# Patient Record
Sex: Male | Born: 1950 | Hispanic: Yes | Marital: Married | Smoking: Never smoker
Health system: Southern US, Community
[De-identification: ages and names within clinical notes are randomized; demographics above are authoritative.]

## PROBLEM LIST (undated history)

## (undated) DIAGNOSIS — Z789 Other specified health status: Secondary | ICD-10-CM

## (undated) HISTORY — DX: Other specified health status: Z78.9

## (undated) HISTORY — PX: HERNIA REPAIR: SHX51

---

## 2021-01-07 DIAGNOSIS — E782 Mixed hyperlipidemia: Secondary | ICD-10-CM | POA: Diagnosis not present

## 2021-01-07 DIAGNOSIS — K2100 Gastro-esophageal reflux disease with esophagitis, without bleeding: Secondary | ICD-10-CM | POA: Diagnosis not present

## 2021-01-07 DIAGNOSIS — E7849 Other hyperlipidemia: Secondary | ICD-10-CM | POA: Diagnosis not present

## 2021-01-13 DIAGNOSIS — R42 Dizziness and giddiness: Secondary | ICD-10-CM | POA: Diagnosis not present

## 2021-01-13 DIAGNOSIS — E7849 Other hyperlipidemia: Secondary | ICD-10-CM | POA: Diagnosis not present

## 2021-01-13 DIAGNOSIS — K5900 Constipation, unspecified: Secondary | ICD-10-CM | POA: Diagnosis not present

## 2021-01-13 DIAGNOSIS — K2100 Gastro-esophageal reflux disease with esophagitis, without bleeding: Secondary | ICD-10-CM | POA: Diagnosis not present

## 2021-01-13 DIAGNOSIS — R7989 Other specified abnormal findings of blood chemistry: Secondary | ICD-10-CM | POA: Diagnosis not present

## 2021-05-05 ENCOUNTER — Emergency Department (HOSPITAL_COMMUNITY)
Admission: EM | Admit: 2021-05-05 | Discharge: 2021-05-06 | Disposition: A | Discharge status: Home / Self Care | Payer: Medicare Other | Attending: Emergency Medicine | Admitting: Emergency Medicine

## 2021-05-05 ENCOUNTER — Other Ambulatory Visit: Payer: Self-pay

## 2021-05-05 ENCOUNTER — Emergency Department (HOSPITAL_COMMUNITY): Payer: Medicare Other

## 2021-05-05 DIAGNOSIS — N451 Epididymitis: Secondary | ICD-10-CM | POA: Diagnosis not present

## 2021-05-05 DIAGNOSIS — Z8719 Personal history of other diseases of the digestive system: Secondary | ICD-10-CM | POA: Insufficient documentation

## 2021-05-05 DIAGNOSIS — R109 Unspecified abdominal pain: Secondary | ICD-10-CM | POA: Diagnosis not present

## 2021-05-05 DIAGNOSIS — E782 Mixed hyperlipidemia: Secondary | ICD-10-CM | POA: Diagnosis not present

## 2021-05-05 DIAGNOSIS — E7849 Other hyperlipidemia: Secondary | ICD-10-CM | POA: Diagnosis not present

## 2021-05-05 DIAGNOSIS — Z20822 Contact with and (suspected) exposure to covid-19: Secondary | ICD-10-CM | POA: Insufficient documentation

## 2021-05-05 DIAGNOSIS — N50811 Right testicular pain: Secondary | ICD-10-CM | POA: Diagnosis present

## 2021-05-05 DIAGNOSIS — R7989 Other specified abnormal findings of blood chemistry: Secondary | ICD-10-CM | POA: Diagnosis not present

## 2021-05-05 LAB — URINALYSIS, ROUTINE W REFLEX MICROSCOPIC
Bilirubin Urine: NEGATIVE
Glucose, UA: NEGATIVE mg/dL
Ketones, ur: NEGATIVE mg/dL
Nitrite: NEGATIVE
Protein, ur: NEGATIVE mg/dL
Specific Gravity, Urine: 1.013 (ref 1.005–1.030)
pH: 5 (ref 5.0–8.0)

## 2021-05-05 LAB — CBC WITH DIFFERENTIAL/PLATELET
Abs Immature Granulocytes: 0.07 10*3/uL (ref 0.00–0.07)
Basophils Absolute: 0.1 10*3/uL (ref 0.0–0.1)
Basophils Relative: 1 %
Eosinophils Absolute: 0.2 10*3/uL (ref 0.0–0.5)
Eosinophils Relative: 1 %
HCT: 42.1 % (ref 39.0–52.0)
Hemoglobin: 14.2 g/dL (ref 13.0–17.0)
Immature Granulocytes: 1 %
Lymphocytes Relative: 16 %
Lymphs Abs: 1.8 10*3/uL (ref 0.7–4.0)
MCH: 32 pg (ref 26.0–34.0)
MCHC: 33.7 g/dL (ref 30.0–36.0)
MCV: 94.8 fL (ref 80.0–100.0)
Monocytes Absolute: 1.8 10*3/uL — ABNORMAL HIGH (ref 0.1–1.0)
Monocytes Relative: 17 %
Neutro Abs: 7 10*3/uL (ref 1.7–7.7)
Neutrophils Relative %: 64 %
Platelets: 215 10*3/uL (ref 150–400)
RBC: 4.44 MIL/uL (ref 4.22–5.81)
RDW: 13 % (ref 11.5–15.5)
WBC: 10.8 10*3/uL — ABNORMAL HIGH (ref 4.0–10.5)
nRBC: 0 % (ref 0.0–0.2)

## 2021-05-05 LAB — BASIC METABOLIC PANEL
Anion gap: 8 (ref 5–15)
BUN: 20 mg/dL (ref 8–23)
CO2: 25 mmol/L (ref 22–32)
Calcium: 8.9 mg/dL (ref 8.9–10.3)
Chloride: 101 mmol/L (ref 98–111)
Creatinine, Ser: 1.06 mg/dL (ref 0.61–1.24)
GFR, Estimated: >60 mL/min (ref >60)
Glucose, Bld: 93 mg/dL (ref 70–99)
Potassium: 3.9 mmol/L (ref 3.5–5.1)
Sodium: 134 mmol/L — ABNORMAL LOW (ref 135–145)

## 2021-05-05 LAB — RESP PANEL BY RT-PCR (FLU A&B, COVID) ARPGX2
Influenza A by PCR: NEGATIVE
Influenza B by PCR: NEGATIVE
SARS Coronavirus 2 by RT PCR: NEGATIVE

## 2021-05-05 MED ORDER — IOHEXOL 300 MG/ML  SOLN
100.0000 mL | Freq: Once | INTRAMUSCULAR | Status: AC | PRN
  Administered 2021-05-05: 100 mL via INTRAVENOUS

## 2021-05-05 NOTE — ED Provider Triage Note (Signed)
Emergency Medicine Provider Triage Evaluation Note  Eric Bridges , a 71 y.o. male  was evaluated in triage.  Pt complains of testicular pain onset yesterday.  He was evaluated by his primary care provider who noted that he has a hernia going through his testicle.  Patient has associated right testicular pain.  Review of Systems  Positive: As per HPI above Negative:   Physical Exam  BP 131/73 (BP Location: Right Arm)    Pulse 97    Temp 98.7 F (37.1 C) (Oral)    Resp 18    Ht 5' 8.9" (1.75 m)    Wt 85.7 kg    SpO2 97%    BMI 27.99 kg/m  Gen:   Awake, no distress   Resp:  Normal effort MSK:   Moves extremities without difficulty  Other:  Nurse tech present as chaperone for GU exam.  Erythema noted to right testicle.  Right testicle enlarged on exam.  Bulging noted to right testicle with tenderness to palpation.  No obvious evidence of Fournier's on exam.  Medical Decision Making  Medically screening exam initiated at 8:10 PM.  Appropriate orders placed.  Eric Bridges was informed that the remainder of the evaluation will be completed by another provider, this initial triage assessment does not replace that evaluation, and the importance of remaining in the ED until their evaluation is complete.   Cobain Morici A, PA-C 05/05/21 2158

## 2021-05-05 NOTE — ED Triage Notes (Signed)
Pt states that he went to his primary today and was sent here because his right testicular torsion.

## 2021-05-06 DIAGNOSIS — N451 Epididymitis: Secondary | ICD-10-CM | POA: Diagnosis not present

## 2021-05-06 MED ORDER — LEVOFLOXACIN 500 MG PO TABS
500.0000 mg | ORAL_TABLET | Freq: Every day | ORAL | Status: DC
Start: 2021-05-06 — End: 2021-05-06
  Administered 2021-05-06: 500 mg via ORAL
  Filled 2021-05-06: qty 1

## 2021-05-06 MED ORDER — LEVOFLOXACIN 500 MG PO TABS: 500.0000 mg | ORAL_TABLET | Freq: Every day | ORAL | 0 refills | Status: DC

## 2021-05-06 MED ORDER — NAPROXEN 500 MG PO TABS
500.0000 mg | ORAL_TABLET | Freq: Once | ORAL | Status: AC
  Administered 2021-05-06: 500 mg via ORAL
  Filled 2021-05-06: qty 1

## 2021-05-06 NOTE — Discharge Instructions (Addendum)
Your evaluation in the emergency department today showed epididymitis.  Take Levaquin as prescribed until finished.  We recommend follow-up with urology.  Should you desire repair of your inguinal hernia, follow-up with [facility].  Call to make an appointment with their office at the number listed below.  You may see your primary care doctor in the interim to ensure resolution of your epididymitis.  Return to the ED for new or concerning symptoms.

## 2021-05-07 LAB — URINE CULTURE: Culture: <10000 — AB

## 2021-05-08 DIAGNOSIS — N451 Epididymitis: Secondary | ICD-10-CM | POA: Diagnosis not present

## 2021-05-08 DIAGNOSIS — K5900 Constipation, unspecified: Secondary | ICD-10-CM | POA: Diagnosis not present

## 2021-05-08 DIAGNOSIS — K2100 Gastro-esophageal reflux disease with esophagitis, without bleeding: Secondary | ICD-10-CM | POA: Diagnosis not present

## 2021-05-08 DIAGNOSIS — E7849 Other hyperlipidemia: Secondary | ICD-10-CM | POA: Diagnosis not present

## 2021-05-08 NOTE — ED Provider Notes (Signed)
[facility] DEPT Provider Note   CSN: [id number] Arrival date & time: 05/05/21  1919     History  Chief Complaint  Patient presents with   testicle hernia    Eric Bridges is a 71 y.o. male.  71 year old male presents to the emergency department for evaluation of right sided testicular pain which began yesterday.  Pain has been constant and unchanged.  It is slightly improved with rest and aggravated with movement such as when going to stand from a seated position.  He has not had any fevers, inability to void.  Has taken Tylenol for symptoms with little improvement.  Was told in [place] that he does have a right-sided inguinal hernia.  The history is provided by the patient. No language interpreter was used.      Home Medications Prior to Admission medications   Medication Sig Start Date End Date Taking? Authorizing Provider  levofloxacin (LEVAQUIN) 500 MG tablet Take 1 tablet (500 mg total) by mouth daily. Take your first dose on the morning of 05/07/21. 05/06/21  Yes Antonietta Breach, PA-C      Allergies    Patient has no allergy information on record.    Review of Systems   Review of Systems Ten systems reviewed and are negative for acute change, except as noted in the HPI.    Physical Exam Updated Vital Signs BP 128/76    Pulse 74    Temp 98.7 F (37.1 C) (Oral)    Resp 19    Ht 5' 8.9" (1.75 m)    Wt 85.7 kg    SpO2 98%    BMI 27.99 kg/m   Physical Exam Vitals and nursing note reviewed.  Constitutional:      General: He is not in acute distress.    Appearance: He is well-developed. He is not diaphoretic.     Comments: Nontoxic appearing and in NAD  HENT:     Head: Normocephalic and atraumatic.  Eyes:     General: No scleral icterus.    Conjunctiva/sclera: Conjunctivae normal.  Pulmonary:     Effort: Pulmonary effort is normal. No respiratory distress.     Comments: Respirations even and unlabored Genitourinary:    Comments: Deferred.  Chaperoned GU exam completed by prior provider noting "erythema to right testicle.  Right testicle enlarged on exam.  Bulging noted to right testicle with tenderness to palpation." Musculoskeletal:        General: Normal range of motion.     Cervical back: Normal range of motion.  Skin:    General: Skin is warm and dry.     Coloration: Skin is not pale.     Findings: No erythema or rash.  Neurological:     Mental Status: He is alert and oriented to person, place, and time.     Coordination: Coordination normal.  Psychiatric:        Behavior: Behavior normal.    ED Results / Procedures / Treatments   Labs (all labs ordered are listed, but only abnormal results are displayed) Labs Reviewed  URINE CULTURE - Abnormal; Notable for the following components:      Result Value   Culture   (*)    Value: <10,000 COLONIES/mL INSIGNIFICANT GROWTH Performed at [facility], [address] [place], [place] [address]    All other components within normal limits  URINALYSIS, ROUTINE W REFLEX MICROSCOPIC - Abnormal; Notable for the following components:   APPearance HAZY (*)    Hgb  urine dipstick MODERATE (*)    Leukocytes,Ua LARGE (*)    Bacteria, UA RARE (*)    All other components within normal limits  BASIC METABOLIC PANEL - Abnormal; Notable for the following components:   Sodium 134 (*)    All other components within normal limits  CBC WITH DIFFERENTIAL/PLATELET - Abnormal; Notable for the following components:   WBC 10.8 (*)    Monocytes Absolute 1.8 (*)    All other components within normal limits  RESP PANEL BY RT-PCR (FLU A&B, COVID) [id number]    EKG None  Radiology CT ABDOMEN PELVIS W CONTRAST  Result Date: 05/05/2021 CLINICAL DATA:  Testicular pain and swelling EXAM: CT ABDOMEN AND PELVIS WITH CONTRAST TECHNIQUE: Multidetector CT imaging of the abdomen and pelvis was performed using the standard protocol following bolus administration of intravenous contrast. RADIATION  DOSE REDUCTION: This exam was performed according to the departmental dose-optimization program which includes automated exposure control, adjustment of the mA and/or kV according to patient size and/or use of iterative reconstruction technique. CONTRAST:  OMNIPAQUE IOHEXOL 300 MG/ML  SOLN COMPARISON:  Ultrasound from earlier in the same day FINDINGS: Lower chest: Mild scarring is noted in the bases bilaterally. Hepatobiliary: Fatty infiltration of the liver is noted. The gallbladder is decompressed. Pancreas: Unremarkable. No pancreatic ductal dilatation or surrounding inflammatory changes. Spleen: Normal in size without focal abnormality. Adrenals/Urinary Tract: Adrenal glands are within normal limits bilaterally. Kidneys demonstrate a normal enhancement pattern. Normal excretion is noted bilaterally. Scattered cysts are noted within the kidneys bilaterally. The largest of these is noted in the anterior upper pole on the left measuring 2.2 cm. No renal calculi or obstructive changes are noted. The bladder is well distended. Stomach/Bowel: Scattered diverticular change of the colon is noted without evidence of diverticulitis. The appendix is within normal limits. Small bowel and stomach are unremarkable. Vascular/Lymphatic: No significant vascular findings are present. No enlarged abdominal or pelvic lymph nodes. Reproductive: Prostate is prominent indenting upon the inferior aspect of the bladder. Mild hyperemia is noted in the right scrotum similar to that seen on the prior ultrasound examination consistent with the known epididymitis. Other: Large fat containing right inguinal hernia is noted. This may contribute to the patient's underlying discomfort. Musculoskeletal: No acute or significant osseous findings. IMPRESSION: Hyperemia in the right scrotum consistent with the known epididymitis. Large fat containing right inguinal hernia which may contribute to the patient's discomfort. Bilateral renal cysts.  Diverticulosis of the colon without diverticulitis. Electronically Signed   By: Alcide Clever M.D.   On: 05/05/2021 23:05   US SCROTUM W/DOPPLER  Result Date: 05/05/2021 CLINICAL DATA:  Right testicle pain and swelling EXAM: SCROTAL ULTRASOUND DOPPLER ULTRASOUND OF THE TESTICLES TECHNIQUE: Complete ultrasound examination of the testicles, epididymis, and other scrotal structures was performed. Color and spectral Doppler ultrasound were also utilized to evaluate blood flow to the testicles. COMPARISON:  None. FINDINGS: Right testicle Measurements: 4 x 3.2 x 3.3 cm. No mass or microlithiasis visualized. Left testicle Measurements: 4.6 x 2.9 x 3.2 cm. No mass or microlithiasis visualized. Right epididymis: Enlarged heterogeneous epididymal tail with hyperemia. Left epididymis:  Normal in size and appearance. Hydrocele:  Small bilateral hydroceles. Varicocele:  None visualized. Pulsed Doppler interrogation of both testes demonstrates normal low resistance arterial and venous waveforms bilaterally. IMPRESSION: 1. Negative for testicular torsion. 2. Slightly enlarged hyperemic right epididymal tail suggesting epididymitis 3. Small bilateral hydroceles Electronically Signed   By: Jasmine Pang M.D.   On: 05/05/2021 21:30  Procedures Procedures    Medications Ordered in ED Medications  iohexol (OMNIPAQUE) 300 MG/ML solution 100 mL (100 mLs Intravenous Contrast Given 05/05/21 2242)  naproxen (NAPROSYN) tablet 500 mg (500 mg Oral Given 05/06/21 0133)    ED Course/ Medical Decision Making/ A&P Clinical Course as of 05/08/21 1750  Tue May 06, 2021  0108 On chart review, patient had CT abdomen pelvis in 2018 which showed fat-containing right inguinal hernia.  This is persistent on imaging today without evidence of bowel involvement. [KH]    Clinical Course User Index [KH] Antonietta Breach, PA-C                           Medical Decision Making Amount and/or Complexity of Data Reviewed Labs:  ordered.  Risk Prescription drug management.   This patient presents to the ED for concern of R testicular pain, this involves an extensive number of treatment options, and is a complaint that carries with it a high risk of complications and morbidity.  The differential diagnosis includes epididymitis vs hydrocele vs orchitis vs testicular mass vs hernia vs testicular torsion.   Co morbidities that complicate the patient evaluation  Obesity    Additional history obtained:  Additional history obtained from wife at bedside External records from outside source obtained and reviewed including prior CT imaging from 2018   Lab Tests:  I Ordered, and personally interpreted labs.  The pertinent results include:  Mild leukocytosis of 10.8, UA c/w pyuria (large leuks @ 21-50, microscopic hematuria @ 11-20)   Imaging Studies ordered:  I ordered imaging studies including CT abdomen pelvis and scrotal US  I independently visualized and interpreted imaging which showed R inguinal hernia containing fat only; no bowel involvement. Imaging findings also c/w R epididymitis.  I agree with the radiologist interpretation   Cardiac Monitoring:  The patient was maintained on a cardiac monitor.  I personally viewed and interpreted the cardiac monitored which showed an underlying rhythm of: NSR   Medicines ordered and prescription drug management:  I ordered medication including Levaquin 500mg  PO for treatment initiation of epididymitis, Naproxen 500mg  PO for pain Reevaluation of the patient after these medicines showed that the patient improved I have reviewed the patients home medicines and have made adjustments as needed   Reevaluation:  After the interventions noted above, I reevaluated the patient and found that they have :improved   Social Determinants of Health:  Insured Good social support, wife at bedside   Dispostion:  After consideration of the diagnostic results and the  patients response to treatment, I feel that the patent would benefit from initiation of Levaquin 500mg  QD x 10 days with outpatient Urologic follow up. Referral to Urology provided. Patient also given referral to general surgery should he desire elective hernia repair. No evidence bowel involvement with R inguinal hernia. Hernia is known to be chronic since at least 2018; stable. Return precautions discussed and provided. Patient discharged in stable condition with no unaddressed concerns.          Final Clinical Impression(s) / ED Diagnoses Final diagnoses:  Epididymitis, right  History of right inguinal hernia    Rx / DC Orders ED Discharge Orders          Ordered    Ambulatory referral to Urology       Comments: Epididymitis   05/06/21 0115    levofloxacin (LEVAQUIN) 500 MG tablet  Daily        05/06/21  Finley, Smantha Boakye, PA-C [id number]    Campbell Stall P, DO [id number]

## 2021-05-22 ENCOUNTER — Ambulatory Visit (INDEPENDENT_AMBULATORY_CARE_PROVIDER_SITE_OTHER): Payer: Medicare Other | Admitting: Urology

## 2021-05-22 ENCOUNTER — Other Ambulatory Visit: Payer: Self-pay

## 2021-05-22 ENCOUNTER — Encounter: Payer: Self-pay | Admitting: Urology

## 2021-05-22 VITALS — BP 164/94 | HR 74 | Ht 68.9 in | Wt 189.0 lb

## 2021-05-22 DIAGNOSIS — N529 Male erectile dysfunction, unspecified: Secondary | ICD-10-CM

## 2021-05-22 DIAGNOSIS — N451 Epididymitis: Secondary | ICD-10-CM | POA: Diagnosis not present

## 2021-05-22 DIAGNOSIS — K4090 Unilateral inguinal hernia, without obstruction or gangrene, not specified as recurrent: Secondary | ICD-10-CM | POA: Diagnosis not present

## 2021-05-22 MED ORDER — SILDENAFIL CITRATE 20 MG PO TABS: ORAL_TABLET | ORAL | 11 refills | Status: AC

## 2021-05-22 NOTE — Progress Notes (Signed)
? ?Assessment: ?1. Epididymitis, right   ?2. Non-recurrent unilateral inguinal hernia without obstruction or gangrene   ?3. Organic impotence   ? ? ?Plan: ?I reviewed the patient's chart including his ER visit, lab results, and imaging results. ?Diagnosis of epididymitis discussed with the patient and his wife. ?He requests referral to general surgery for evaluation of his right inguinal hernia. ?Prescription for sildenafil 50 mg as needed provided. ?Return to office as needed. ? ?Chief Complaint:  ?Chief Complaint  ?Patient presents with  ? epididymitis  ? ? ?History of Present Illness: ? ?Eric Bridges is a 71 y.o. year old male who is seen in consultation from Antony Madura, PA-C for evaluation of right epididymitis.  He presented to the emergency room on 05/05/2021 with right scrotal pain and swelling.  He noted onset of some discomfort as well as subjective fever and chills approximately 3 days prior.  He also noted some dysuria.  Urinalysis showed 11-20 RBCs, 21-50 WBCs and rare bacteria.  WBC 10.8.  Scrotal ultrasound demonstrated an enlarged hyperemic right epididymal tail suggestive of epididymitis.  CT imaging showed normal kidneys with bilateral renal cysts, no stones or evidence of obstruction and a large fat-containing right inguinal hernia.  He was treated with Levaquin x10 days.  His symptoms resolved.  He is no longer having any scrotal pain.  He has been aware of the right inguinal hernia for several years.  He is interested in possible surgery. ? ?He also reports difficulty achieving and maintaining his erection.  He has no decrease in his libido.  He has never taken any medical therapy for this.  He does not use any nitrates. ? ?History obtained through the assistance of an interpreter. ? ?Past Medical History:  ?Past Medical History:  ?Diagnosis Date  ? Known health problems: none   ? ? ?Past Surgical History:  ?Past Surgical History:  ?Procedure Laterality Date  ? HERNIA REPAIR    ? ? ?Allergies:   ?Allergies  ?Allergen Reactions  ? Penicillin G Other (See Comments)  ?  Made him feel bad as a child  ? ? ?Family History:  ?History reviewed. No pertinent family history. ? ?Social History:  ?Social History  ? ?Tobacco Use  ? Smoking status: Never  ? Smokeless tobacco: Never  ?Substance Use Topics  ? Alcohol use: Not Currently  ? ? ?Review of symptoms:  ?Constitutional:  Negative for unexplained weight loss, night sweats, fever, chills ?ENT:  Negative for nose bleeds, sinus pain, painful swallowing ?CV:  Negative for chest pain, shortness of breath, exercise intolerance, palpitations, loss of consciousness ?Resp:  Negative for cough, wheezing, shortness of breath ?GI:  Negative for nausea, vomiting, diarrhea, bloody stools ?GU:  Positives noted in HPI; otherwise negative for gross hematuria, urinary incontinence ?Neuro:  Negative for seizures, poor balance, limb weakness, slurred speech ?Psych:  Negative for lack of energy, depression, anxiety ?Endocrine:  Negative for polydipsia, polyuria, symptoms of hypoglycemia (dizziness, hunger, sweating) ?Hematologic:  Negative for anemia, purpura, petechia, prolonged or excessive bleeding, use of anticoagulants  ?Allergic:  Negative for difficulty breathing or choking as a result of exposure to anything; no shellfish allergy; no allergic response (rash/itch) to materials, foods ? ?Physical exam: ?BP (!) 164/94   Pulse 74   Ht 5' 8.9" (1.75 m)   Wt 189 lb (85.7 kg)   BMI 27.99 kg/m?  ?GENERAL APPEARANCE:  Well appearing, well developed, well nourished, NAD ?HEENT: Atraumatic, Normocephalic, oropharynx clear. ?NECK: Supple without lymphadenopathy or thyromegaly. ?LUNGS: Clear to  auscultation bilaterally. ?HEART: Regular Rate and Rhythm without murmurs, gallops, or rubs. ?ABDOMEN: Soft, non-tender, No Masses.  Reducible right inguinal hernia ?EXTREMITIES: Moves all extremities well.  Without clubbing, cyanosis, or edema. ?NEUROLOGIC:  Alert and oriented x 3, normal gait,  CN II-XII grossly intact.  ?MENTAL STATUS:  Appropriate. ?BACK:  Non-tender to palpation.  No CVAT ?SKIN:  Warm, dry and intact.   ?GU: ?Penis:  uncircumcised ?Meatus: Normal ?Scrotum: No significant erythema; slight enlargement of right scrotum ?Testis: normal without masses bilateral ?Epididymis: Some enlargement of the right epididymal head, nontender ? ? ?Results: ?U/A 0-2 RBC ? ?

## 2021-05-23 DIAGNOSIS — N451 Epididymitis: Secondary | ICD-10-CM | POA: Diagnosis not present

## 2021-05-23 LAB — URINALYSIS, ROUTINE W REFLEX MICROSCOPIC
Bilirubin, UA: NEGATIVE
Glucose, UA: NEGATIVE
Ketones, UA: NEGATIVE
Leukocytes,UA: NEGATIVE
Nitrite, UA: NEGATIVE
Protein,UA: NEGATIVE
Specific Gravity, UA: 1.025 (ref 1.005–1.030)
Urobilinogen, Ur: 2 mg/dL — ABNORMAL HIGH (ref 0.2–1.0)
pH, UA: 6 (ref 5.0–7.5)

## 2021-05-23 LAB — MICROSCOPIC EXAMINATION
Bacteria, UA: NONE SEEN
Epithelial Cells (non renal): NONE SEEN /hpf (ref 0–10)
Renal Epithel, UA: NONE SEEN /hpf
WBC, UA: NONE SEEN /hpf (ref 0–5)

## 2021-05-23 NOTE — Addendum Note (Signed)
Addended by: Ferdinand Lango on: 05/23/2021 09:31 AM ? ? Modules accepted: Orders ? ?

## 2021-05-29 ENCOUNTER — Encounter: Payer: Self-pay | Admitting: *Deleted

## 2021-06-10 ENCOUNTER — Encounter: Payer: Self-pay | Admitting: General Surgery

## 2021-06-10 ENCOUNTER — Other Ambulatory Visit: Payer: Self-pay

## 2021-06-10 ENCOUNTER — Ambulatory Visit (INDEPENDENT_AMBULATORY_CARE_PROVIDER_SITE_OTHER): Payer: Medicare Other | Admitting: General Surgery

## 2021-06-10 VITALS — BP 156/95 | HR 82 | Temp 97.9°F | Resp 16 | Ht 68.0 in | Wt 186.0 lb

## 2021-06-10 DIAGNOSIS — K4090 Unilateral inguinal hernia, without obstruction or gangrene, not specified as recurrent: Secondary | ICD-10-CM

## 2021-06-10 NOTE — Patient Instructions (Signed)
Call once you are ready to schedule your surgery. If you want it in April (at the end) we can accomodate that or we can do it in August. We will just see you before surgery in August if you opt for that timeframe.  ? ?Llame una vez que est? listo para programar su cirug?a. Si lo quieres en abril (al final) podemos acomodarlo o podemos hacerlo en agosto. Solo lo veremos antes de la cirug?a en agosto si opta por ese per?odo de tiempo. ? ?Open Hernia Repair, Adult ?Open hernia repair is a surgical procedure to fix a hernia. A hernia occurs when an internal organ or tissue pushes through a weak spot in the muscles along the wall of the abdomen. Hernias commonly occur in the groin and around the belly button. ?Most hernias tend to get worse over time. Often, surgery is done to prevent the hernia from becoming bigger, uncomfortable, or an emergency. Emergency surgery may be needed if contents of the abdomen get stuck in the opening (incarcerated hernia) or if the blood supply gets cut off (strangulated hernia). In an open repair, an incision is made in the abdomen to perform the surgery. ?Tell a health care provider about: ?Any allergies you have. ?All medicines you are taking, including vitamins, herbs, eye drops, creams, and over-the-counter medicines. ?Any problems you or family members have had with anesthetic medicines. ?Any blood or bone disorders you have. ?Any surgeries you have had. ?Any medical conditions you have, including any recent cold or flu (influenza)symptoms. ?Whether you are pregnant or may be pregnant. ?What are the risks? ?Generally, this is a safe procedure. However, problems may occur, including: ?Long-lasting (chronic) pain. ?Bleeding. ?Infection. ?Damage to the testicles. This can cause shrinking or swelling. ?Damage to nearby structures or organs, including the bladder, blood vessels, intestines, or nerves near the hernia. ?Blood clots. ?Trouble passing urine. ?Return of the hernia. ?What  happens before the procedure? ?Medicines ?Ask your health care provider about: ?Changing or stopping your regular medicines. This is especially important if you are taking diabetes medicines or blood thinners. ?Taking medicines such as aspirin and ibuprofen. These medicines can thin your blood. Do not take these medicines unless your health care provider tells you to take them. ?Taking over-the-counter medicines, vitamins, herbs, and supplements. ?Surgery safety ?Ask your health care provider: ?How your surgery site will be marked. ?What steps will be taken to help prevent infection. These steps may include: ?Removing hair at the surgery site. ?Washing skin with a germ-killing soap. ?Receiving antibiotic medicine. ?General instructions ?You may have an exam or testing, such as blood tests or imaging studies. ?Do not use any products that contain nicotine or tobacco for at least 4 weeks before the procedure. These products include cigarettes, chewing tobacco, and vaping devices, such as e-cigarettes. If you need help quitting, ask your health care provider. ?Let your health care provider know if you develop a cold or any infection before your surgery. If you get an infection before surgery, you may receive antibiotics to treat it. ?Plan to have a responsible adult take you home from the hospital or clinic. ?If you will be going home right after the procedure, plan to have a responsible adult care for you for the time you are told. This is important. ?What happens during the procedure? ? ?An IV will be inserted into one of your veins. ?You will be given one or more of the following: ?A medicine to help you relax (sedative). ?A medicine to numb the  area (local anesthetic). ?A medicine to make you fall asleep (general anesthetic). ?Your surgeon will make an incision over the hernia. ?The tissues of the hernia will be moved back into place. ?The edges of the hernia may be stitched (sutured) together. ?The opening in the  abdominal muscles will be closed with stitches (sutures). Or, your surgeon will place a mesh patch made of artificial (synthetic) material over the opening. ?The incision will be closed with sutures, skin glue, or adhesive strips. ?A bandage (dressing) may be placed over the incision. ?The procedure may vary among health care providers and hospitals. ?What happens after the procedure? ?Your blood pressure, heart rate, breathing rate, and blood oxygen level will be monitored until you leave the hospital or clinic. ?You may be given medicine for pain. ?If you were given a sedative during the procedure, it can affect you for several hours. Do not drive or operate machinery until your health care provider says that it is safe. ?Summary ?Open hernia repair is a surgical procedure to fix a hernia. Hernias commonly occur in the groin and around the belly button. ?Emergency surgery may be needed if contents of the abdomen get stuck in the opening (incarcerated hernia) or if the blood supply gets cut off (strangulated hernia). ?In this procedure, an incision is made in the abdomen to perform the surgery. ?After the procedure, you may be given medicine for pain. ?This information is not intended to replace advice given to you by your health care provider. Make sure you discuss any questions you have with your health care provider. ?Document Revised: 10/16/2019 Document Reviewed: 10/16/2019 ?Elsevier Patient Education ? 2022 Elsevier Inc. ? ?Reparaci?n abierta de una hernia, en adultos ?Open Hernia Repair, Adult ?La reparaci?n abierta de una hernia es un procedimiento quir?rgico que se realiza para arreglar una hernia. Una hernia ocurre cuando un ?rgano o tejido interno protruye a trav?s de un punto d?bil de los m?sculos que conforman la pared del abdomen. Las hernias se producen con mayor frecuencia en la ingle y alrededor del ombligo. ?En su mayor?a, las hernias suelen empeorar con el tiempo. Con frecuencia, se realiza una  cirug?a para evitar que la hernia se agrande, genere molestias o por Radio broadcast assistant. Es posible que sea necesaria una cirug?a de emergencia si el contenido del abdomen queda atascado en la abertura (hernia encarcelada) o se interrumpe el suministro de sangre (hernia estrangulada). En una reparaci?n abierta, la incisi?n se realiza en el abdomen para Printmaker cirug?a. ?Informe al m?dico acerca de lo siguiente: ?Cualquier alergia que tenga. ?Todos los [employer] [place], incluidos vitaminas, hierbas, gotas oft?lmicas, cremas y [address] de [address]. ?Problemas previos que usted o alg?n miembro de su familia hayan tenido con los anest?sicos. ?Enfermedades de la sangre o los huesos que tenga. ?Cirug?as a las que se haya sometido. ?Cualquier afecci?n m?dica que tenga, incluido cualquier s?ntoma reciente de resfr?o o gripe (influenza). ?Si est? embarazada o podr?a estarlo. ??Cu?les son los riesgos? ?En general, se trata de un procedimiento seguro. Sin embargo, pueden ocurrir complicaciones, por ejemplo: ?Dolor por Merchandiser, retail (cr?nico). ?Sangrado. ?Infecci?n. ?Da?o en los test?culos. Esto puede provocar atrofia e hinchaz?n. ?Da?os a las estructuras u ?rganos cercanos, incluidos la vejiga, los vasos sangu?neos, los intestinos o los nervios pr?ximos a la hernia. ?Co?gulos de sangre. ?Dificultad para orinar. ?Reaparici?n de la hernia. ??Qu? ocurre antes del procedimiento? ?Medicamentos ?Consulte al m?dico sobre: ?Dance movement psychotherapist los medicamentos que [place] habitualmente. Esto es muy importante si toma medicamentos para la diabetes  o anticoagulantes. ?Tomar medicamentos como aspirina e ibuprofeno. Estos medicamentos pueden tener un efecto anticoagulante en la Vida. No tome estos medicamentos a menos que el m?dico se lo indique. ?Usar medicamentos de venta libre, vitaminas, hierbas y suplementos. ?Seguridad en la cirug?a ?Preg?ntele al m?dico: ?C?mo se marcar? Immunologist de la cirug?a. ?Qu? medidas se tomar?n para  evitar una infecci?n. Estas medidas pueden incluir las siguientes: ?Rasurar el vello del lugar de la cirug?a. ?Lavar la piel con un jab?n antis?ptico. ?Recibir antibi?ticos. ?Instrucciones generales ?Es posi
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
## 2021-06-12 ENCOUNTER — Encounter: Payer: Self-pay | Admitting: General Surgery

## 2021-10-13 DIAGNOSIS — M47812 Spondylosis without myelopathy or radiculopathy, cervical region: Secondary | ICD-10-CM | POA: Diagnosis not present

## 2021-12-29 DIAGNOSIS — H01002 Unspecified blepharitis right lower eyelid: Secondary | ICD-10-CM | POA: Diagnosis not present

## 2021-12-29 DIAGNOSIS — H01004 Unspecified blepharitis left upper eyelid: Secondary | ICD-10-CM | POA: Diagnosis not present

## 2021-12-29 DIAGNOSIS — H01001 Unspecified blepharitis right upper eyelid: Secondary | ICD-10-CM | POA: Diagnosis not present

## 2021-12-29 DIAGNOSIS — H2513 Age-related nuclear cataract, bilateral: Secondary | ICD-10-CM | POA: Diagnosis not present

## 2022-03-05 NOTE — H&P (Signed)
Surgical History & Physical  Patient Name: Eric Bridges DOB: Jan 26, 1951  Surgery: Cataract extraction with intraocular lens implant phacoemulsification; Right Eye  Surgeon: Fabio Pierce MD Surgery Date:  03-20-22 Pre-Op Date:  02-26-22  HPI: A 79 Yr. old male patient is self referred for cataract eval. Pt saw an eye dr 2 years ago, was told that he had beginning of cataracts. 1. The patient complains of difficulty when recognizing people/seeing small captions on TV, which began 3 years ago. Both eyes are affected. The episode is gradual. The condition's severity increased since last visit. Symptoms occur when the patient is driving, inside, outside and reading. HPI was performed by Fabio Pierce .  Medical History: Cataract  Review of Systems Negative Allergic/Immunologic Negative Cardiovascular Negative Constitutional Negative Ear, Nose, Mouth & Throat Negative Endocrine Negative Eyes Negative Gastrointestinal Negative Genitourinary Negative Hemotologic/Lymphatic Negative Integumentary Negative Musculoskeletal Negative Neurological Negative Psychiatry Negative Respiratory  Social   Never smoked   Medication Multivitamin,   Sx/Procedures Inguinal Hernia,   Drug Allergies  Penicilin,   History & Physical: Heent: Cataract, right eye NECK: supple without bruits LUNGS: lungs clear to auscultation CV: regular rate and rhythm Abdomen: soft and non-tender Impression & Plan: Assessment: 1.  NUCLEAR SCLEROSIS AGE RELATED; Both Eyes (H25.13) 2.  BLEPHARITIS; Right Upper Lid, Right Lower Lid, Left Upper Lid, Left Lower Lid (H01.001, H01.002,H01.004,H01.005) 3.  CONJUNCTIVOCHALASIS; Both Eyes (H11.823) 4.  Pinguecula; Both Eyes (H11.153) 5.  DERMATOCHALASIS, no surgery; Right Upper Lid, Left Upper Lid (H02.831, H02.834) 6.  ASTIGMATISM, REGULAR; Both Eyes (H52.223)  Plan: 1.  Cataract accounts for the patient's decreased vision. This visual impairment is not correctable  with a tolerable change in glasses or contact lenses. Cataract surgery with an implantation of a new lens should significantly improve the visual and functional status of the patient. Discussed all risks, benefits, alternatives, and potential complications. Discussed the procedures and recovery. Patient desires to have surgery. A-scan ordered and performed today for intra-ocular lens calculations. The surgery will be performed in order to improve vision for driving, reading, and for eye examinations. Recommend phacoemulsification with intra-ocular lens. Recommend Dextenza for post-operative pain and inflammation. Right Eye worse - first Dilates poorly - shugarcaine by protocol. Malyugin Ring. Omidira. Recommend Toric Lens.  2.  Recommend regular lid cleaning.  3.  Asymptomatic.  4.  Observe; Artificial tears as needed for irritation.  5.  Asymptomatic, recommend observation for now. Findings, prognosis and treatment options reviewed.  6.  Recommend toric IOL OU.

## 2022-03-13 ENCOUNTER — Encounter (HOSPITAL_COMMUNITY)
Admission: RE | Admit: 2022-03-13 | Discharge: 2022-03-13 | Disposition: A | Discharge status: Home / Self Care | Payer: Medicare Other | Source: Ambulatory Visit | Attending: Ophthalmology | Admitting: Ophthalmology

## 2022-03-17 NOTE — Pre-Procedure Instructions (Signed)
Attempted pre-op phone call. Patient states he is starting a new job and cannot do his surgery until March. Mackie Pai notified, left her a VM to call for questions.

## 2022-05-11 ENCOUNTER — Encounter (HOSPITAL_COMMUNITY)
Admission: RE | Admit: 2022-05-11 | Discharge: 2022-05-11 | Disposition: A | Discharge status: Home / Self Care | Payer: 59 | Source: Ambulatory Visit | Attending: Ophthalmology | Admitting: Ophthalmology

## 2022-05-15 ENCOUNTER — Ambulatory Visit (HOSPITAL_COMMUNITY): Admission: RE | Admit: 2022-05-15 | Payer: 59 | Source: Ambulatory Visit | Admitting: Ophthalmology

## 2022-05-15 ENCOUNTER — Encounter (HOSPITAL_COMMUNITY): Admission: RE | Payer: Self-pay | Source: Ambulatory Visit

## 2022-05-15 SURGERY — PHACOEMULSIFICATION, CATARACT, WITH IOL INSERTION
Anesthesia: Monitor Anesthesia Care | Laterality: Right

## 2022-06-19 DIAGNOSIS — K2100 Gastro-esophageal reflux disease with esophagitis, without bleeding: Secondary | ICD-10-CM | POA: Diagnosis not present

## 2022-06-19 DIAGNOSIS — K5900 Constipation, unspecified: Secondary | ICD-10-CM | POA: Diagnosis not present

## 2022-06-19 DIAGNOSIS — E7849 Other hyperlipidemia: Secondary | ICD-10-CM | POA: Diagnosis not present

## 2022-06-19 DIAGNOSIS — R030 Elevated blood-pressure reading, without diagnosis of hypertension: Secondary | ICD-10-CM | POA: Diagnosis not present

## 2022-06-19 DIAGNOSIS — L821 Other seborrheic keratosis: Secondary | ICD-10-CM | POA: Diagnosis not present

## 2023-03-19 DIAGNOSIS — R7989 Other specified abnormal findings of blood chemistry: Secondary | ICD-10-CM | POA: Diagnosis not present

## 2023-03-19 DIAGNOSIS — E7849 Other hyperlipidemia: Secondary | ICD-10-CM | POA: Diagnosis not present

## 2023-03-26 DIAGNOSIS — K2100 Gastro-esophageal reflux disease with esophagitis, without bleeding: Secondary | ICD-10-CM | POA: Diagnosis not present

## 2023-03-26 DIAGNOSIS — S161XXA Strain of muscle, fascia and tendon at neck level, initial encounter: Secondary | ICD-10-CM | POA: Diagnosis not present

## 2023-03-26 DIAGNOSIS — R030 Elevated blood-pressure reading, without diagnosis of hypertension: Secondary | ICD-10-CM | POA: Diagnosis not present

## 2023-03-26 DIAGNOSIS — E7849 Other hyperlipidemia: Secondary | ICD-10-CM | POA: Diagnosis not present

## 2023-04-22 IMAGING — CT CT ABD-PELV W/ CM
2 of 5 series · 15 of 46 positions shown, 17 images · IV contrast (agent unspecified)
Comparison: Ultrasound from earlier in the same day

CLINICAL DATA: Testicular pain and swelling

EXAM:
CT ABDOMEN AND PELVIS WITH CONTRAST
TECHNIQUE: Multidetector CT imaging of the abdomen and pelvis was performed
using the standard protocol following bolus administration of
intravenous contrast.

[Series 2: axial st · axial · 0.84mm/px · z∈[-526,-36]mm · 12 of 116 slices shown, 14 images]
[im 9/116  soft-tissue]
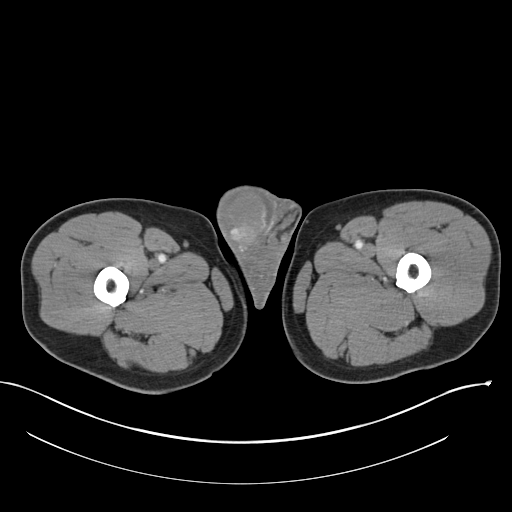
[im 9/116  bone]
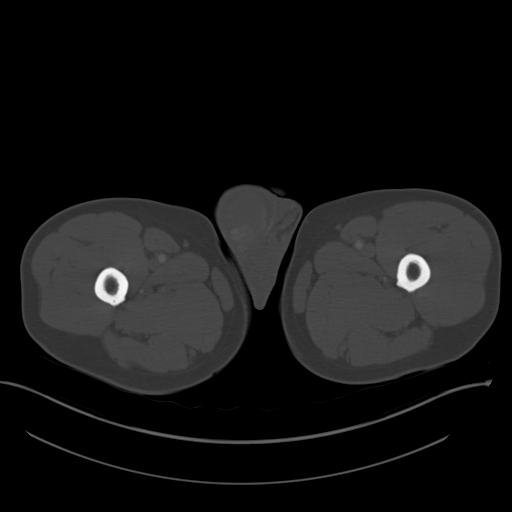
[im 17/116  soft-tissue]
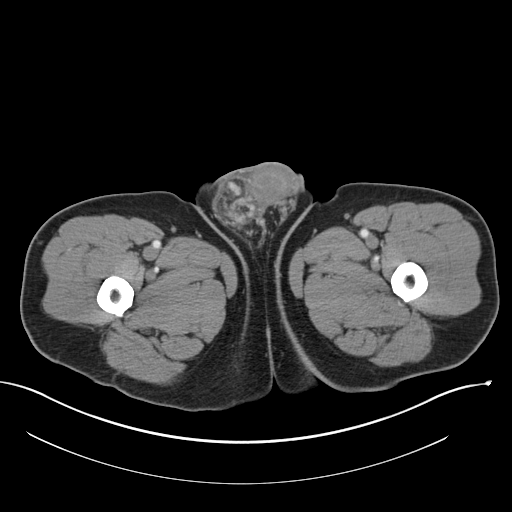
[im 25/116  soft-tissue]
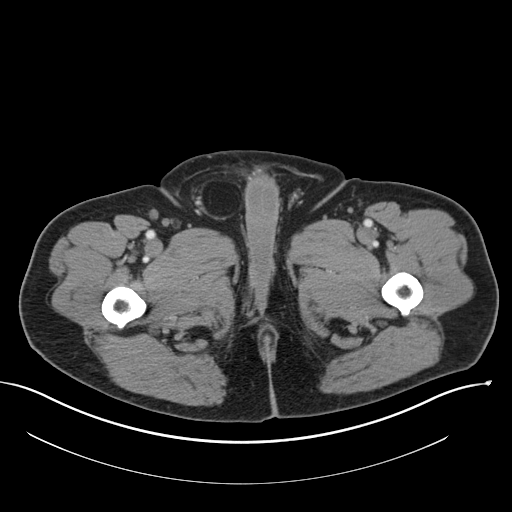
[im 33/116  soft-tissue]
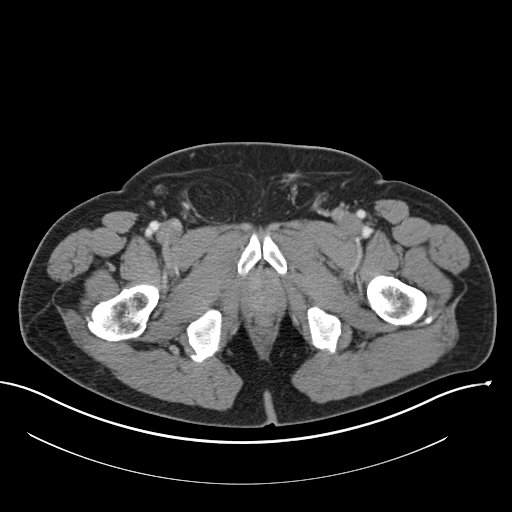
[im 42/116  soft-tissue]
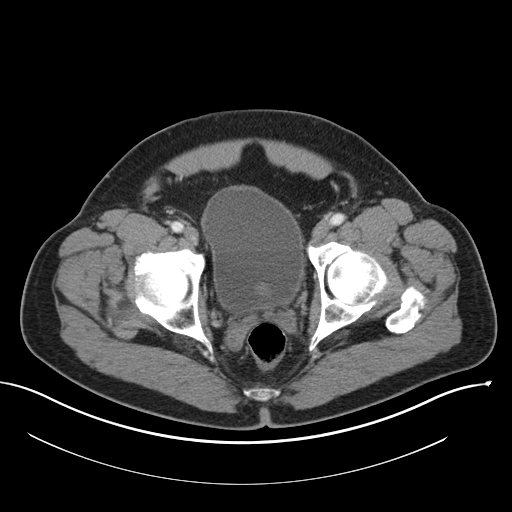
[im 50/116  soft-tissue]
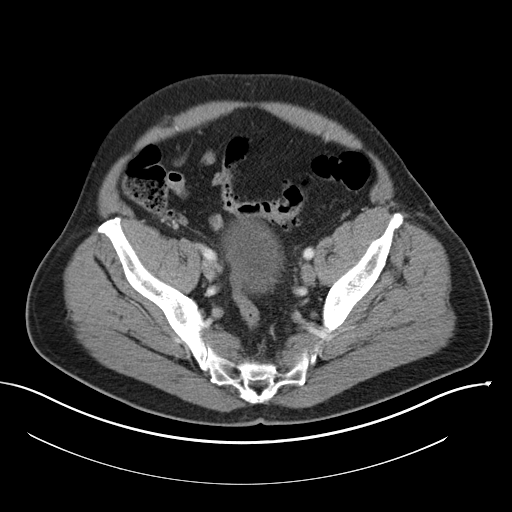
[im 66/116  soft-tissue]
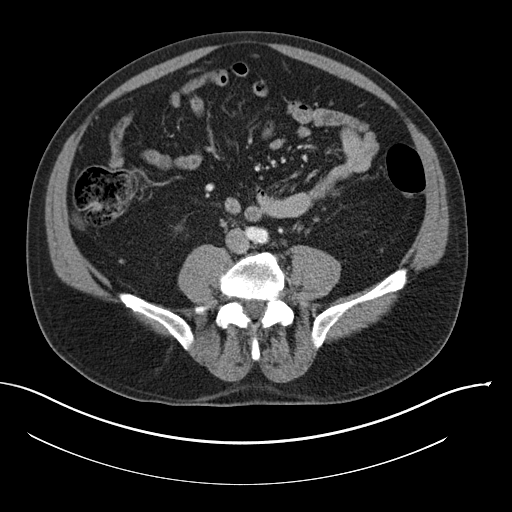
[im 74/116  soft-tissue]
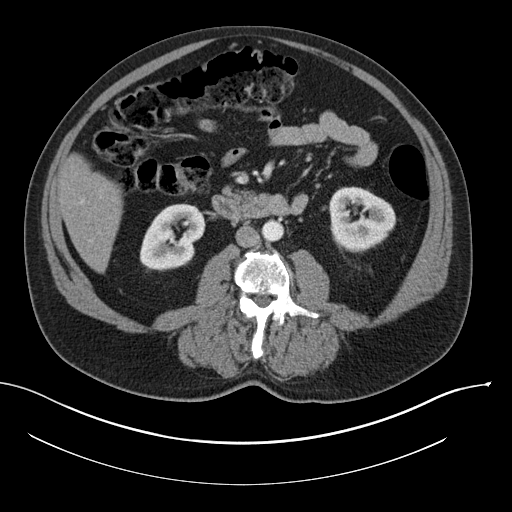
[im 83/116  soft-tissue]
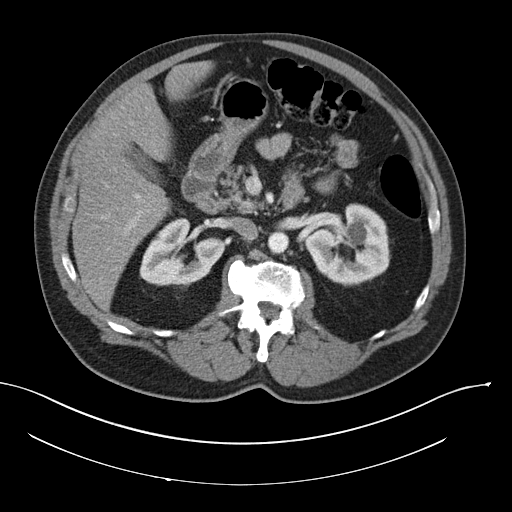
[im 83/116  bone]
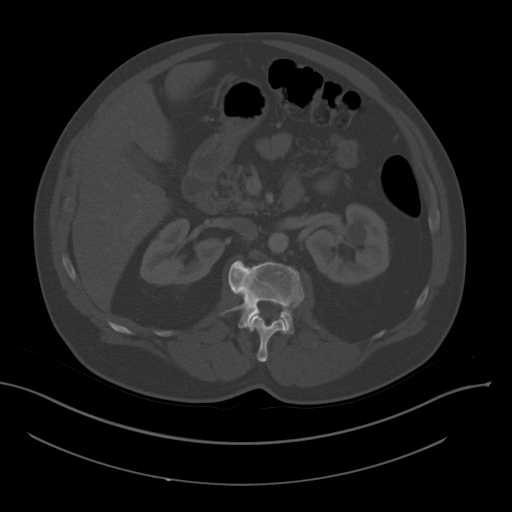
[im 91/116  soft-tissue]
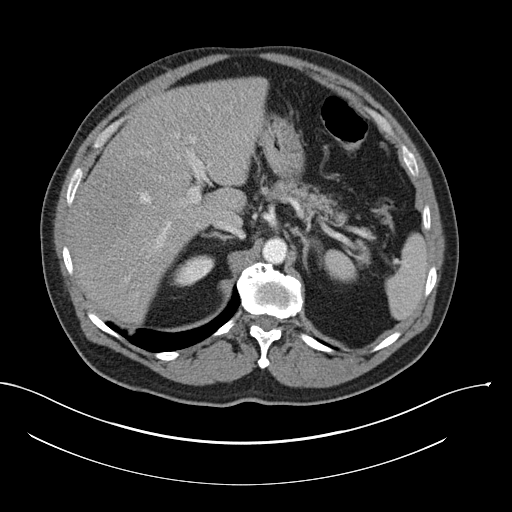
[im 99/116  soft-tissue]
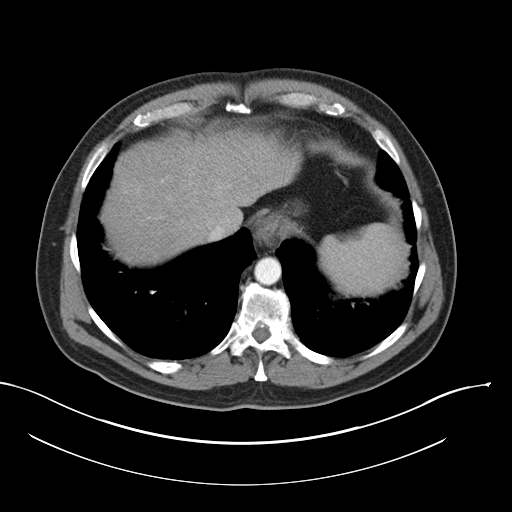
[im 107/116  soft-tissue]
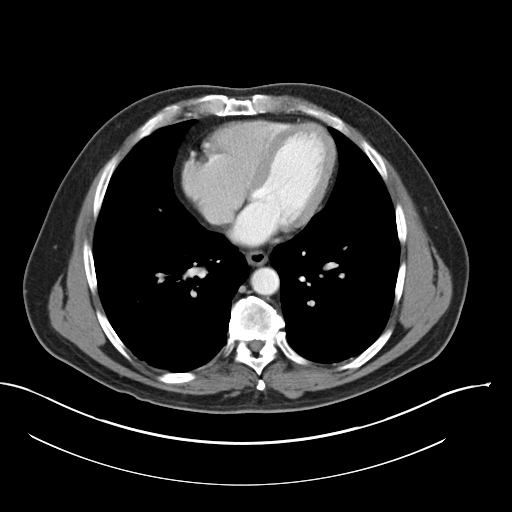

[Series 4: coronal st · coronal · 0.90mm/px · 3 of 168 slices shown]
[im 56/168  soft-tissue]
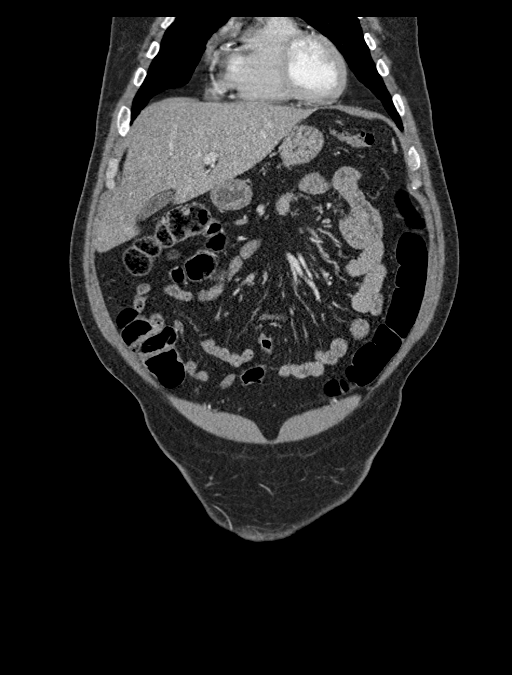
[im 75/168  soft-tissue]
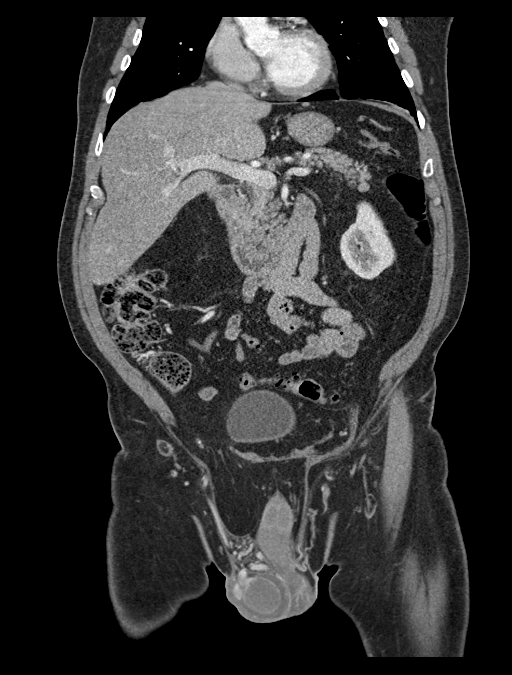
[im 93/168  soft-tissue]
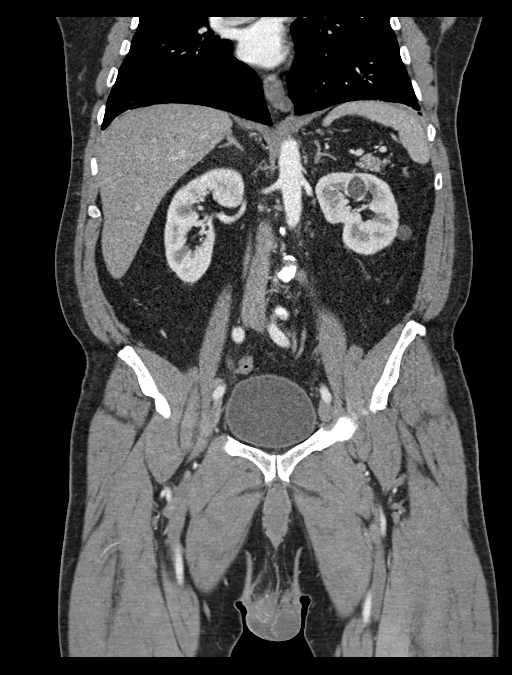

[15 of 46 positions shown; findings below may reference images not displayed]

RADIATION DOSE REDUCTION: This exam was performed according to the
departmental dose-optimization program which includes automated
exposure control, adjustment of the mA and/or kV according to
patient size and/or use of iterative reconstruction technique.

CONTRAST:  100mL OMNIPAQUE IOHEXOL 300 MG/ML  SOLN
FINDINGS: Lower chest: Mild scarring is noted in the bases bilaterally.

Hepatobiliary: Fatty infiltration of the liver is noted. The
gallbladder is decompressed.

Pancreas: Unremarkable. No pancreatic ductal dilatation or
surrounding inflammatory changes.

Spleen: Normal in size without focal abnormality.

Adrenals/Urinary Tract: Adrenal glands are within normal limits
bilaterally. Kidneys demonstrate a normal enhancement pattern.
Normal excretion is noted bilaterally. Scattered cysts are noted
within the kidneys bilaterally. The largest of these is noted in the
anterior upper pole on the left measuring 2.2 cm. No renal calculi
or obstructive changes are noted. The bladder is well distended.

Stomach/Bowel: Scattered diverticular change of the colon is noted
without evidence of diverticulitis. The appendix is within normal
limits. Small bowel and stomach are unremarkable.

Vascular/Lymphatic: No significant vascular findings are present. No
enlarged abdominal or pelvic lymph nodes.

Reproductive: Prostate is prominent indenting upon the inferior
aspect of the bladder. Mild hyperemia is noted in the right scrotum
similar to that seen on the prior ultrasound examination consistent
with the known epididymitis.

Other: Large fat containing right inguinal hernia is noted. This may
contribute to the patient's underlying discomfort.

Musculoskeletal: No acute or significant osseous findings.
IMPRESSION: Hyperemia in the right scrotum consistent with the known
epididymitis.

Large fat containing right inguinal hernia which may contribute to
the patient's discomfort.

Bilateral renal cysts.

Diverticulosis of the colon without diverticulitis.

## 2023-04-22 IMAGING — US US SCROTUM W/ DOPPLER COMPLETE
1 series · 15 of 25 positions shown · non-contrast
Comparison: None.

CLINICAL DATA: Right testicle pain and swelling

EXAM:
SCROTAL ULTRASOUND
DOPPLER ULTRASOUND OF THE TESTICLES
TECHNIQUE: Complete ultrasound examination of the testicles, epididymis, and
other scrotal structures was performed. Color and spectral Doppler
ultrasound were also utilized to evaluate blood flow to the
testicles.

[Series 1: us art/ven flow abd pelv doppl mc & wl · 15 of 65 slices shown]
[im 1/65]
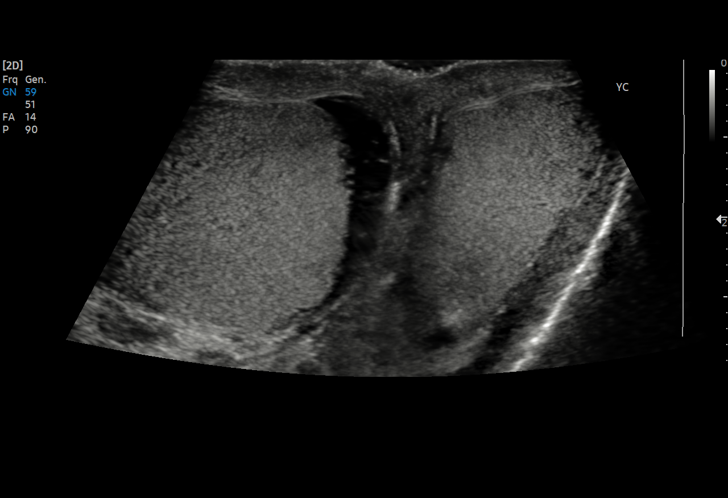
[im 6/65]
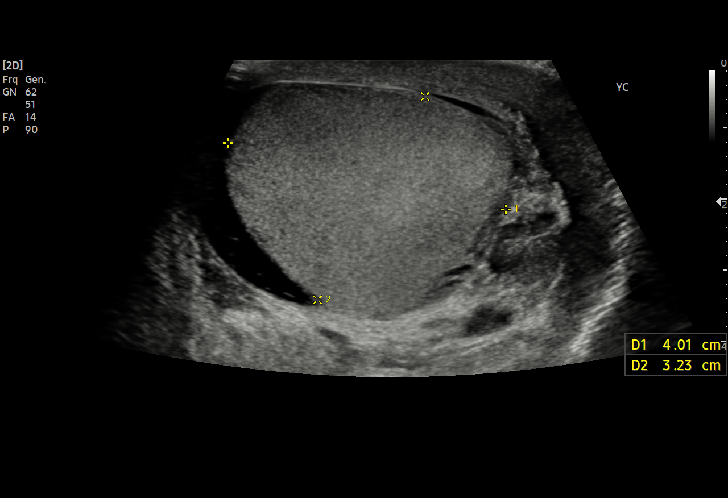
[im 11/65]
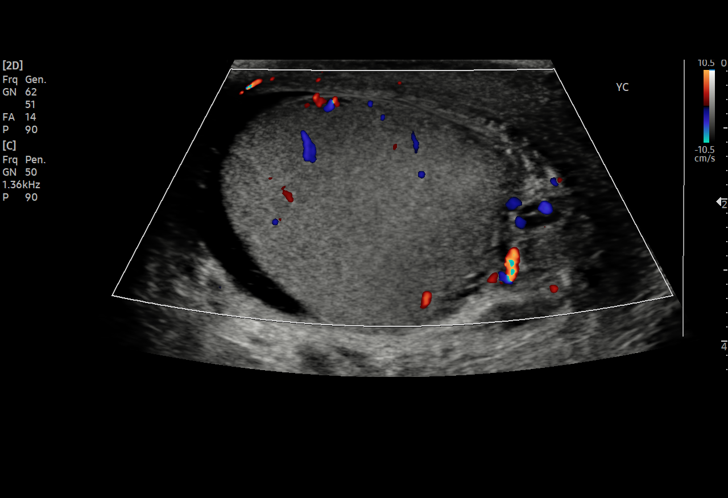
[im 14/65]
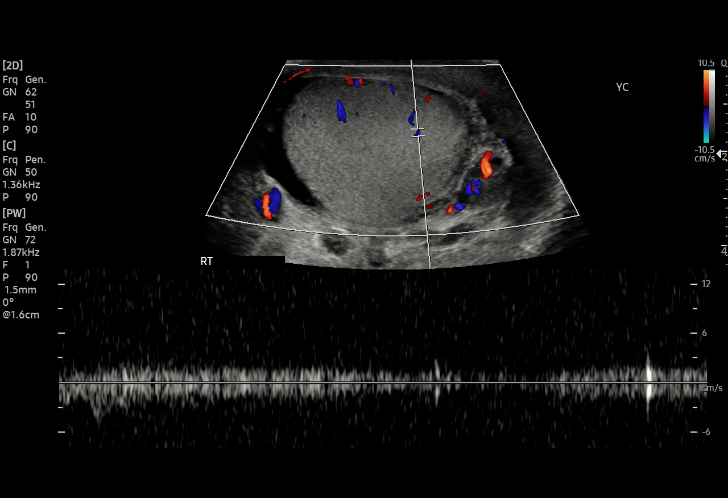
[im 19/65]
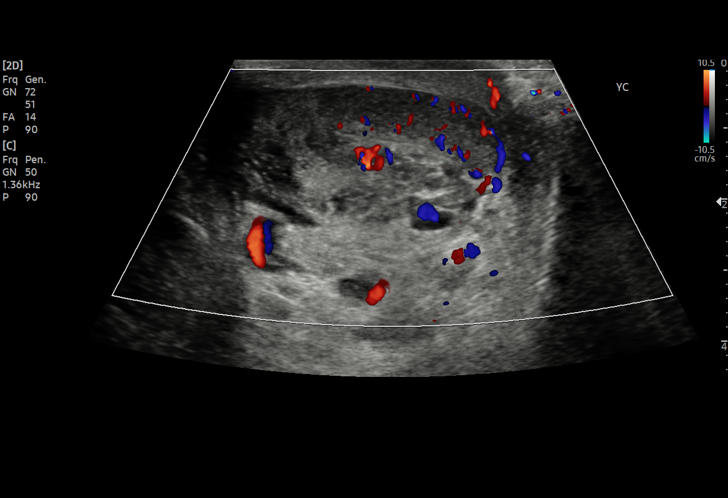
[im 25/65]
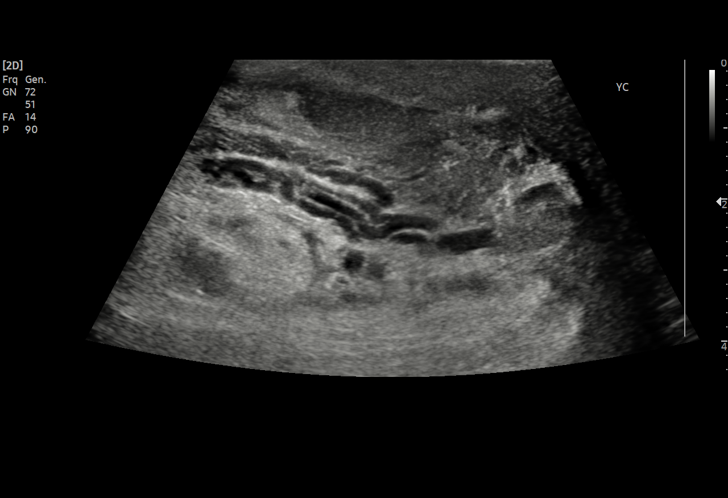
[im 27/65]
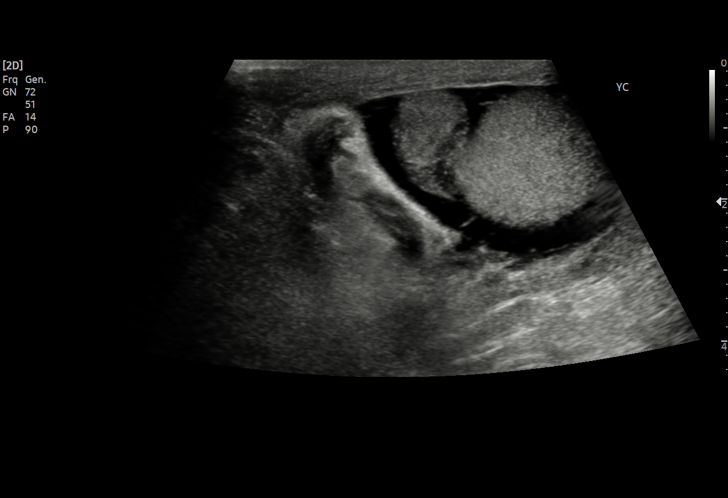
[im 33/65]
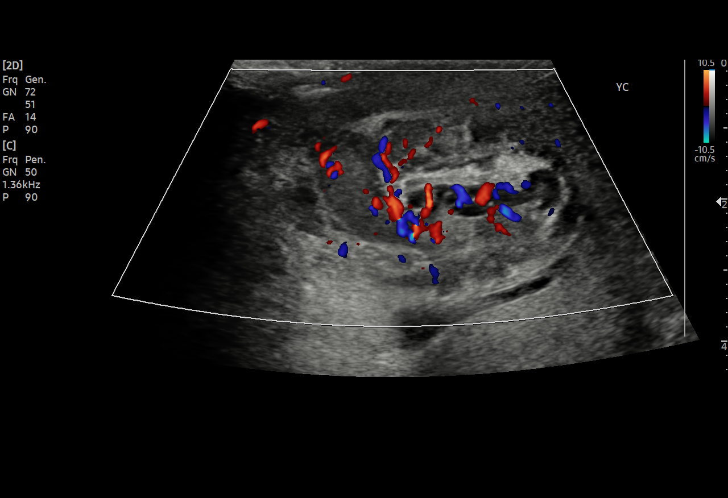
[im 38/65]
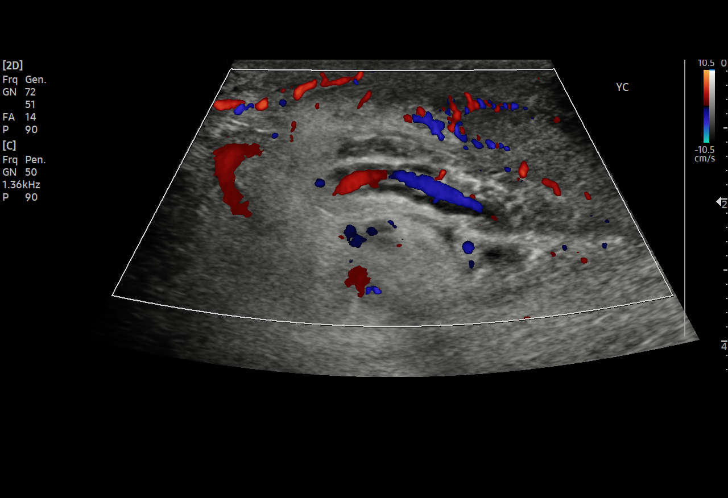
[im 41/65]
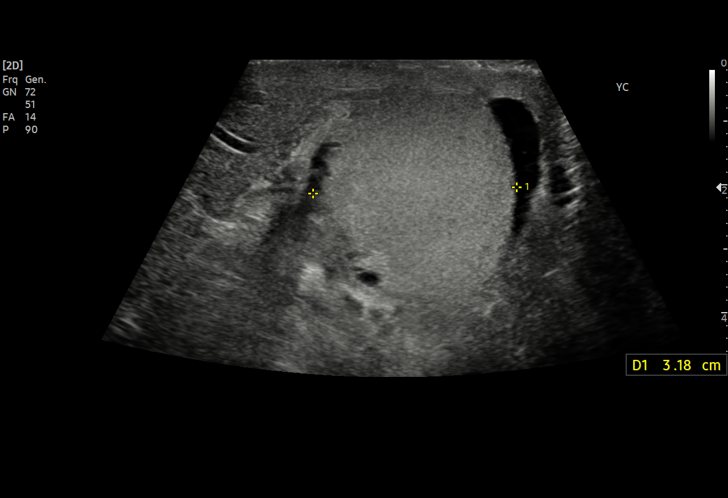
[im 46/65]
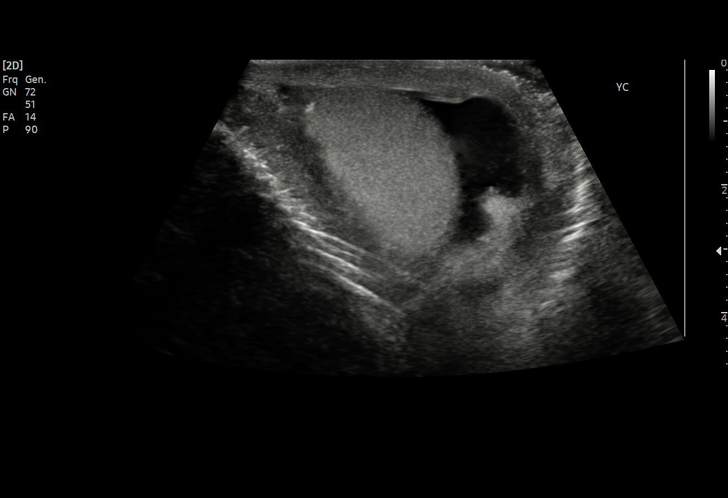
[im 51/65]
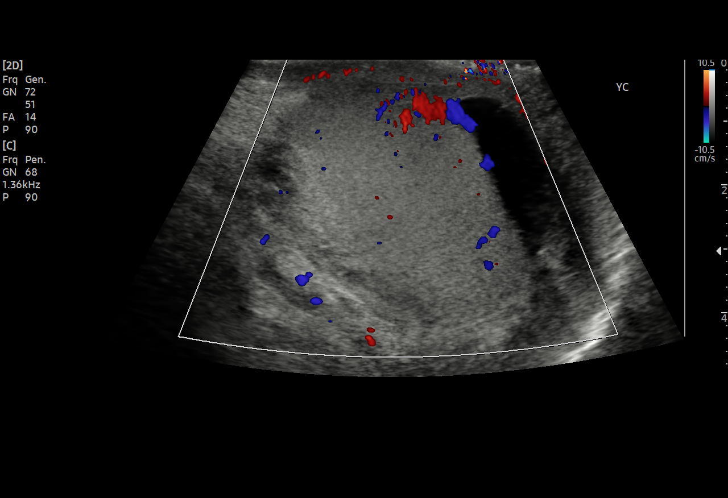
[im 54/65]
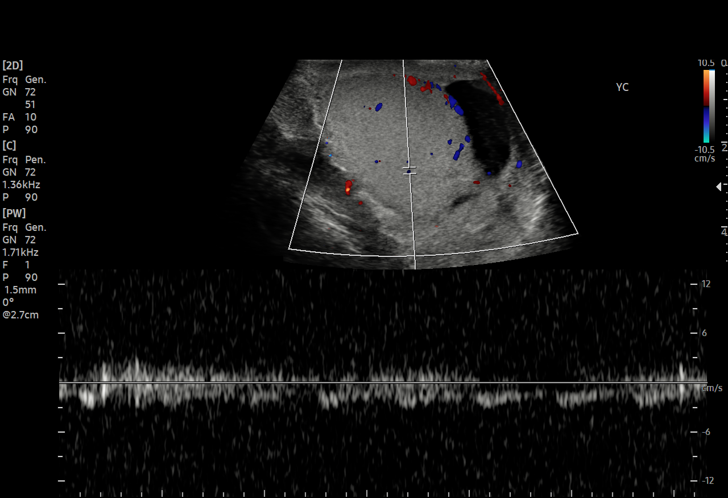
[im 59/65]
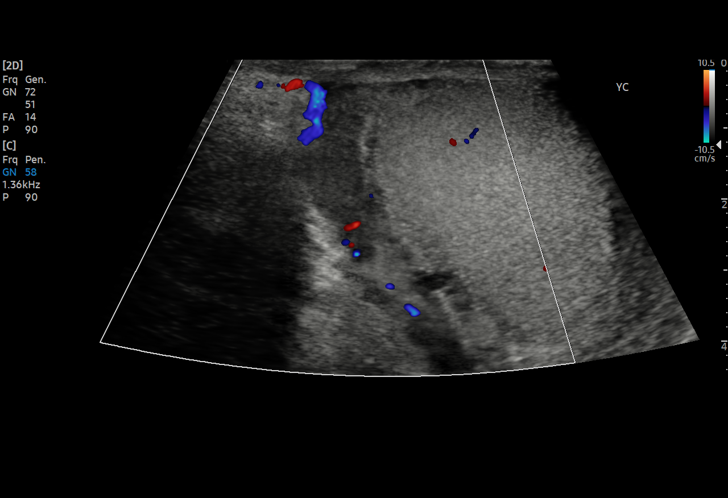
[im 65/65]
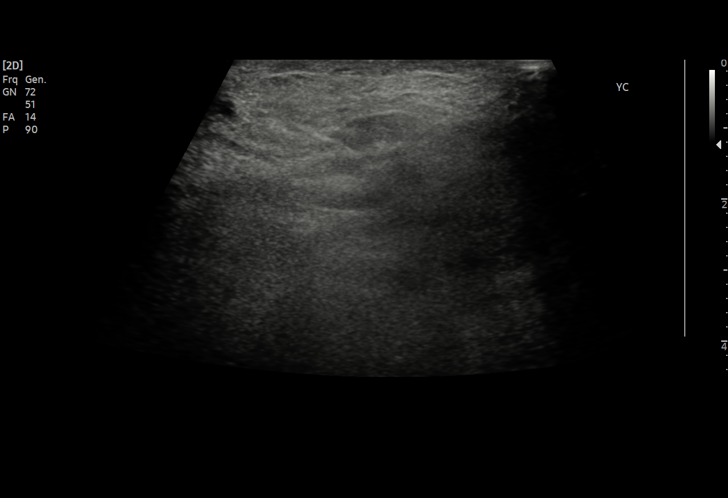

[15 of 25 positions shown; findings below may reference images not displayed]

FINDINGS: Right testicle

Measurements: 4 x 3.2 x 3.3 cm. No mass or microlithiasis
visualized.

Left testicle

Measurements: 4.6 x 2.9 x 3.2 cm. No mass or microlithiasis
visualized.

Right epididymis: Enlarged heterogeneous epididymal tail with
hyperemia.

Left epididymis:  Normal in size and appearance.

Hydrocele:  Small bilateral hydroceles.

Varicocele:  None visualized.

Pulsed Doppler interrogation of both testes demonstrates normal low
resistance arterial and venous waveforms bilaterally.
IMPRESSION: 1. Negative for testicular torsion.
2. Slightly enlarged hyperemic right epididymal tail suggesting
epididymitis
3. Small bilateral hydroceles
